# Patient Record
Sex: Male | Born: 1995 | Hispanic: Yes | Marital: Single | Smoking: Former smoker
Health system: Southern US, Community
[De-identification: ages and names within clinical notes are randomized; demographics above are authoritative.]

## PROBLEM LIST (undated history)

## (undated) DIAGNOSIS — E7800 Pure hypercholesterolemia, unspecified: Secondary | ICD-10-CM

## (undated) DIAGNOSIS — I10 Essential (primary) hypertension: Secondary | ICD-10-CM

---
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
## 2018-12-15 ENCOUNTER — Emergency Department
Admission: EM | Admit: 2018-12-15 | Discharge: 2018-12-15 | Disposition: A | Discharge status: Home / Self Care | Payer: BC Managed Care – PPO | Attending: Emergency Medicine | Admitting: Emergency Medicine

## 2018-12-15 ENCOUNTER — Emergency Department: Payer: BC Managed Care – PPO

## 2018-12-15 DIAGNOSIS — R079 Chest pain, unspecified: Secondary | ICD-10-CM | POA: Diagnosis not present

## 2018-12-15 DIAGNOSIS — Z87891 Personal history of nicotine dependence: Secondary | ICD-10-CM | POA: Insufficient documentation

## 2018-12-15 LAB — CBC
HCT: 47.5 % (ref 39.0–52.0)
Hemoglobin: 16.3 g/dL (ref 13.0–17.0)
MCH: 30.6 pg (ref 26.0–34.0)
MCHC: 34.3 g/dL (ref 30.0–36.0)
MCV: 89.3 fL (ref 80.0–100.0)
Platelets: 234 10*3/uL (ref 150–400)
RBC: 5.32 MIL/uL (ref 4.22–5.81)
RDW: 11.9 % (ref 11.5–15.5)
WBC: 6.3 10*3/uL (ref 4.0–10.5)
nRBC: 0 % (ref 0.0–0.2)

## 2018-12-15 LAB — BASIC METABOLIC PANEL
Anion gap: 11 (ref 5–15)
BUN: 8 mg/dL (ref 6–20)
CO2: 26 mmol/L (ref 22–32)
Calcium: 8.7 mg/dL — ABNORMAL LOW (ref 8.9–10.3)
Chloride: 102 mmol/L (ref 98–111)
Creatinine, Ser: 0.8 mg/dL (ref 0.61–1.24)
GFR calc Af Amer: >60 mL/min (ref >60)
GFR calc non Af Amer: >60 mL/min (ref >60)
Glucose, Bld: 108 mg/dL — ABNORMAL HIGH (ref 70–99)
Potassium: 3.8 mmol/L (ref 3.5–5.1)
Sodium: 139 mmol/L (ref 135–145)

## 2018-12-15 LAB — TROPONIN I (HIGH SENSITIVITY): Troponin I (High Sensitivity): 3 ng/L (ref <18)

## 2018-12-15 MED ORDER — SODIUM CHLORIDE 0.9% FLUSH: 3.0000 mL | Freq: Once | INTRAVENOUS | Status: DC

## 2018-12-15 NOTE — ED Notes (Signed)
Patient denies chest pain at this time, says it "went away" while he was waiting for room

## 2018-12-15 NOTE — ED Provider Notes (Signed)
[facility] Emergency Department Provider Note  ____________________________________________   First MD Initiated Contact with Patient 12/15/18 [phone]     (approximate)  I have reviewed the triage vital signs and the nursing notes.   HISTORY  Chief Complaint Chest Pain    HPI Matthew Holden is a 23 y.o. male reports no chronic medical issues and presents for evaluation of right-sided chest pain.  He states it happened when he was going to sleep or just after he went to sleep.  He describes as a sharp pain in the far right side of his chest.  He says it completely went away while he was waiting for an exam room.  He had no shortness of breath.  He had no injury.  He has had no contact with COVID-19 patients.  He denies sore throat, cough, congestion, nausea, vomiting, and abdominal pain.  He has never had chest pain like this in the past.  He denies drug use.  He has no history of blood clots in the legs of the lungs.   The pain is completely gone at this time.       History reviewed. No pertinent past medical history.  There are no active problems to display for this patient.   History reviewed. No pertinent surgical history.  Prior to Admission medications   Not on File    Allergies Patient has no known allergies.  No family history on file.  Social History Social History   Tobacco Use  . Smoking status: Former Research scientist (life sciences)  . Smokeless tobacco: Never Used  Substance Use Topics  . Alcohol use: Yes  . Drug use: Not on file    Review of Systems Constitutional: No fever/chills Eyes: No visual changes. ENT: No sore throat. Cardiovascular: +chest pain. Respiratory: Denies shortness of breath. Gastrointestinal: No abdominal pain.  No nausea, no vomiting.  No diarrhea.  No constipation. Genitourinary: Negative for dysuria. Musculoskeletal: Negative for neck pain.  Negative for back pain. Integumentary: Negative for rash. Neurological: Negative  for headaches, focal weakness or numbness.   ____________________________________________   PHYSICAL EXAM:  VITAL SIGNS: ED Triage Vitals  Enc Vitals Group     BP 12/15/18 0243 (!) 141/89     Pulse Rate 12/15/18 0243 93     Resp 12/15/18 0243 14     Temp 12/15/18 0243 98.7 F (37.1 C)     Temp Source 12/15/18 0243 Oral     SpO2 12/15/18 0243 100 %     Weight 12/15/18 0236 127 kg (280 lb)     Height 12/15/18 0236 1.702 m (5\' 7" )     Head Circumference --      Peak Flow --      Pain Score 12/15/18 0235 8     Pain Loc --      Pain Edu? --      Excl. in [place]? --     Constitutional: Alert and oriented. Well appearing and in no acute distress. Eyes: Conjunctivae are normal.  Head: Atraumatic. Nose: No congestion/rhinnorhea. Mouth/Throat: Mucous membranes are moist. Neck: No stridor.  No meningeal signs.   Cardiovascular: Normal rate, regular rhythm. Good peripheral circulation. Grossly normal heart sounds. Respiratory: Normal respiratory effort.  No retractions. No audible wheezing. Gastrointestinal: Soft and nontender. No distention.  Musculoskeletal: No reproducible chest wall tenderness no lower extremity tenderness nor edema. No gross deformities of extremities. Neurologic:  Normal speech and language. No gross focal neurologic deficits are appreciated.  Skin:  Skin is warm,  dry and intact. No rash noted. Psychiatric: Mood and affect are normal. Speech and behavior are normal.  ____________________________________________   LABS (all labs ordered are listed, but only abnormal results are displayed)  Labs Reviewed  BASIC METABOLIC PANEL - Abnormal; Notable for the following components:      Result Value   Glucose, Bld 108 (*)    Calcium 8.7 (*)    All other components within normal limits  CBC  TROPONIN I (HIGH SENSITIVITY)  TROPONIN I (HIGH SENSITIVITY)   ____________________________________________  EKG  ED ECG REPORT I, Loleta Roseory Rajat Staver, the attending  physician, personally viewed and interpreted this ECG.  Date: 12/15/2018 EKG Time: 2:37 AM Rate: 91 Rhythm: normal sinus rhythm QRS Axis: normal Intervals: normal ST/T Wave abnormalities: Non-specific ST segment / T-wave changes, but no clear evidence of acute ischemia. Narrative Interpretation: no definitive evidence of acute ischemia; does not meet STEMI criteria.   ____________________________________________  RADIOLOGY   ED MD interpretation: No indication of acute intrathoracic abnormality on chest x-ray  Official radiology report(s): Dg Chest Port 1 View  Result Date: 12/15/2018 CLINICAL DATA:  Chest pain with mild cough EXAM: PORTABLE CHEST 1 VIEW COMPARISON:  None. FINDINGS: Low lung volumes. There is no edema, consolidation, effusion, or pneumothorax. Normal heart size and mediastinal contours. IMPRESSION: Negative low volume chest. Electronically Signed   By: Marnee SpringJonathon  Watts M.D.   On: 12/15/2018 06:25    ____________________________________________   PROCEDURES   Procedure(s) performed (including Critical Care):  Procedures   ____________________________________________   INITIAL IMPRESSION / MDM / ASSESSMENT AND PLAN / ED COURSE  As part of my medical decision making, I reviewed the following data within the electronic MEDICAL RECORD NUMBER Nursing notes reviewed and incorporated, Labs reviewed , EKG interpreted , Radiograph reviewed  and Notes from prior ED visits   Differential diagnosis includes, but is not limited to, musculoskeletal pain, costochondritis, PE, ACS, acid reflux, pneumothorax, pneumonia.  The patient is in no distress with normal vital signs.  PERC negative, very low risk for ACS.  Nonischemic EKG, normal chest x-ray, normal lab work including a high-sensitivity troponin of 3.  He does not meet criteria for retesting of the troponin.  The patient says he has no symptoms and wants to go home.  I offered coronavirus testing but given that he has no  other symptoms at this time I do not think it is necessary and he does not want the test.  I gave my usual customary return precautions.       ____________________________________________  FINAL CLINICAL IMPRESSION(S) / ED DIAGNOSES  Final diagnoses:  Chest pain     MEDICATIONS GIVEN DURING THIS VISIT:  Medications  sodium chloride flush (NS) 0.9 % injection 3 mL (has no administration in time range)     ED Discharge Orders    None      *Please note:  Maryfrances BunnellFredrick Minteer was evaluated in Emergency Department on 12/15/2018 for the symptoms described in the history of present illness. He was evaluated in the context of the global COVID-19 pandemic, which necessitated consideration that the patient might be at risk for infection with the SARS-CoV-2 virus that causes COVID-19. Institutional protocols and algorithms that pertain to the evaluation of patients at risk for COVID-19 are in a state of rapid change based on information released by regulatory bodies including the CDC and federal and state organizations. These policies and algorithms were followed during the patient's care in the ED.  Some ED evaluations and interventions may  be delayed as a result of limited staffing during the pandemic.*  Note:  This document was prepared using Dragon voice recognition software and may include unintentional dictation errors.   Loleta RoseForbach, Geovonni Meyerhoff, MD 12/15/18 [phone]

## 2018-12-15 NOTE — Discharge Instructions (Addendum)
You have been seen in the Emergency Department (ED) today for chest pain.  As we have discussed today?s test results are normal, but you may require further testing. ° °Please follow up with the recommended doctor as instructed above in these documents regarding today?s emergent visit and your recent symptoms to discuss further management.  ° °Return to the Emergency Department (ED) if you experience any further chest pain/pressure/tightness, difficulty breathing, or sudden sweating, or other symptoms that concern you. ° °

## 2018-12-15 NOTE — ED Triage Notes (Signed)
Patient c/o medial chest pain. Patient denies other symptoms.

## 2018-12-15 NOTE — ED Notes (Signed)
Pt discharged home after verbalizing understanding of discharge instructions; nad noted. 

## 2019-04-30 ENCOUNTER — Emergency Department
Admission: EM | Admit: 2019-04-30 | Discharge: 2019-04-30 | Disposition: A | Discharge status: Home / Self Care | Payer: BC Managed Care – PPO | Attending: Emergency Medicine | Admitting: Emergency Medicine

## 2019-04-30 ENCOUNTER — Emergency Department: Payer: BC Managed Care – PPO

## 2019-04-30 ENCOUNTER — Encounter: Payer: Self-pay | Admitting: Emergency Medicine

## 2019-04-30 DIAGNOSIS — R079 Chest pain, unspecified: Secondary | ICD-10-CM | POA: Diagnosis present

## 2019-04-30 DIAGNOSIS — F1210 Cannabis abuse, uncomplicated: Secondary | ICD-10-CM | POA: Diagnosis not present

## 2019-04-30 DIAGNOSIS — R091 Pleurisy: Secondary | ICD-10-CM

## 2019-04-30 DIAGNOSIS — Z87891 Personal history of nicotine dependence: Secondary | ICD-10-CM | POA: Diagnosis not present

## 2019-04-30 LAB — BASIC METABOLIC PANEL
Anion gap: 12 (ref 5–15)
BUN: 15 mg/dL (ref 6–20)
CO2: 23 mmol/L (ref 22–32)
Calcium: 9.2 mg/dL (ref 8.9–10.3)
Chloride: 102 mmol/L (ref 98–111)
Creatinine, Ser: 0.99 mg/dL (ref 0.61–1.24)
GFR calc Af Amer: >60 mL/min (ref >60)
GFR calc non Af Amer: >60 mL/min (ref >60)
Glucose, Bld: 150 mg/dL — ABNORMAL HIGH (ref 70–99)
Potassium: 3.5 mmol/L (ref 3.5–5.1)
Sodium: 137 mmol/L (ref 135–145)

## 2019-04-30 LAB — CBC
HCT: 48.1 % (ref 39.0–52.0)
Hemoglobin: 16.6 g/dL (ref 13.0–17.0)
MCH: 30.5 pg (ref 26.0–34.0)
MCHC: 34.5 g/dL (ref 30.0–36.0)
MCV: 88.4 fL (ref 80.0–100.0)
Platelets: 256 10*3/uL (ref 150–400)
RBC: 5.44 MIL/uL (ref 4.22–5.81)
RDW: 11.8 % (ref 11.5–15.5)
WBC: 10 10*3/uL (ref 4.0–10.5)
nRBC: 0 % (ref 0.0–0.2)

## 2019-04-30 LAB — TROPONIN I (HIGH SENSITIVITY): Troponin I (High Sensitivity): 4 ng/L (ref <18)

## 2019-04-30 NOTE — ED Provider Notes (Signed)
[facility] Emergency Department Provider Note   First MD Initiated Contact with Patient 04/30/19 2315     (approximate)  I have reviewed the triage vital signs and the nursing notes.   HISTORY  Chief Complaint Chest Pain   HPI Matthew Holden is a 23 y.o. male presents to the emergency department with anterior burning chest pain" after smoking marijuana tonight.  Patient states that he smoked marijuana around 7:00 and started experiencing burning chest pain following doing so. .  Patient denies any dyspnea or cough.  Patient denies any lower extremity pain or swelling.  Patient states that he had a similar episode 1 month ago.        History reviewed. No pertinent past medical history.  There are no active problems to display for this patient.   History reviewed. No pertinent surgical history.  Prior to Admission medications   Not on File    Allergies Patient has no known allergies.  History reviewed. No pertinent family history.  Social History Social History   Tobacco Use  . Smoking status: Former Games developer  . Smokeless tobacco: Never Used  Substance Use Topics  . Alcohol use: Yes  . Drug use: Not on file    Review of Systems Constitutional: No fever/chills Eyes: No visual changes. ENT: No sore throat. Cardiovascular: Positive for burning chest pain. Respiratory: Denies shortness of breath. Gastrointestinal: No abdominal pain.  No nausea, no vomiting.  No diarrhea.  No constipation. Genitourinary: Negative for dysuria. Musculoskeletal: Negative for neck pain.  Negative for back pain. Integumentary: Negative for rash. Neurological: Negative for headaches, focal weakness or numbness.   ____________________________________________   PHYSICAL EXAM:  VITAL SIGNS: ED Triage Vitals  Enc Vitals Group     BP 04/30/19 2118 (!) 144/90     Pulse Rate 04/30/19 2118 89     Resp 04/30/19 2118 18     Temp 04/30/19 2118 98.8 F (37.1 C)      Temp Source 04/30/19 2118 Oral     SpO2 04/30/19 2118 98 %     Weight --      Height --      Head Circumference --      Peak Flow --      Pain Score 04/30/19 2313 4     Pain Loc --      Pain Edu? --      Excl. in [place]? --     Constitutional: Alert and oriented.  Eyes: Conjunctivae are normal.  Mouth/Throat: Patient is wearing a mask. Neck: No stridor.  No meningeal signs.   Cardiovascular: Normal rate, regular rhythm. Good peripheral circulation. Grossly normal heart sounds. Respiratory: Normal respiratory effort.  No retractions. Gastrointestinal: Soft and nontender. No distention.  Musculoskeletal: No lower extremity tenderness nor edema. No gross deformities of extremities. Neurologic:  Normal speech and language. No gross focal neurologic deficits are appreciated.  Skin:  Skin is warm, dry and intact. Psychiatric: Mood and affect are normal. Speech and behavior are normal.  ____________________________________________   LABS (all labs ordered are listed, but only abnormal results are displayed)  Labs Reviewed  BASIC METABOLIC PANEL - Abnormal; Notable for the following components:      Result Value   Glucose, Bld 150 (*)    All other components within normal limits  CBC  TROPONIN I (HIGH SENSITIVITY)  TROPONIN I (HIGH SENSITIVITY)   ____________________________________________  EKG  ED ECG REPORT I, Wilson N Ardyce Heyer, the attending physician, personally viewed and interpreted  this ECG.   Date: 04/30/2019  EKG Time: 9:20 PM  Rate: 89  Rhythm: Normal sinus rhythm   Axis: Normal  Intervals: Normal  ST&T Change: None  ____________________________________________  RADIOLOGY I, Enterprise N Eythan Jayne, personally viewed and evaluated these images (plain radiographs) as part of my medical decision making, as well as reviewing the written report by the radiologist.  ED MD interpretation: No active cardiopulmonary disease noted on chest x-ray.  Official radiology  report(s): Dg Chest 2 View  Result Date: 04/30/2019 CLINICAL DATA:  23 year old male with chest pain. EXAM: CHEST - 2 VIEW COMPARISON:  Chest radiograph dated 12/15/2018. FINDINGS: No focal consolidation, pleural effusion, or pneumothorax. The cardiac silhouette is within normal limits. No acute osseous pathology. IMPRESSION: No active cardiopulmonary disease. Electronically Signed   By: Anner Crete M.D.   On: 04/30/2019 21:37     Procedures   ____________________________________________   INITIAL IMPRESSION / MDM / [place] / ED COURSE  As part of my medical decision making, I reviewed the following data within the electronic MEDICAL RECORD NUMBER 23 year old male presented with above-stated history and physical exam concerning for possible pleurisy.  EKG was performed which revealed no evidence of ischemia or infarction laboratory data was performed while the patient was in triage revealed a normal high-sensitivity troponin. ____________________________________________  FINAL CLINICAL IMPRESSION(S) / ED DIAGNOSES  Final diagnoses:  Pleurisy     MEDICATIONS GIVEN DURING THIS VISIT:  Medications - No data to display   ED Discharge Orders    None      *Please note:  Matthew Holden was evaluated in Emergency Department on 04/30/2019 for the symptoms described in the history of present illness. He was evaluated in the context of the global COVID-19 pandemic, which necessitated consideration that the patient might be at risk for infection with the SARS-CoV-2 virus that causes COVID-19. Institutional protocols and algorithms that pertain to the evaluation of patients at risk for COVID-19 are in a state of rapid change based on information released by regulatory bodies including the CDC and federal and state organizations. These policies and algorithms were followed during the patient's care in the ED.  Some ED evaluations and interventions may be delayed as a result of  limited staffing during the pandemic.*  Note:  This document was prepared using Dragon voice recognition software and may include unintentional dictation errors.   Gregor Hams, MD 05/01/19 [phone]

## 2019-04-30 NOTE — ED Triage Notes (Signed)
Pt c/o left sided chest pain while hanging out with friend tonight smoking mariajuana. Pt reports pain occurred 1 month ago and was seen here, diagnose with muscle strain. Pt denies SOB, N/V.

## 2019-08-05 ENCOUNTER — Encounter: Payer: Self-pay | Admitting: Emergency Medicine

## 2019-08-05 ENCOUNTER — Emergency Department: Payer: BC Managed Care – PPO

## 2019-08-05 ENCOUNTER — Emergency Department
Admission: EM | Admit: 2019-08-05 | Discharge: 2019-08-05 | Disposition: A | Discharge status: Home / Self Care | Payer: BC Managed Care – PPO | Attending: Emergency Medicine | Admitting: Emergency Medicine

## 2019-08-05 ENCOUNTER — Other Ambulatory Visit: Payer: Self-pay

## 2019-08-05 DIAGNOSIS — K219 Gastro-esophageal reflux disease without esophagitis: Secondary | ICD-10-CM | POA: Diagnosis not present

## 2019-08-05 DIAGNOSIS — I10 Essential (primary) hypertension: Secondary | ICD-10-CM | POA: Insufficient documentation

## 2019-08-05 DIAGNOSIS — Z87891 Personal history of nicotine dependence: Secondary | ICD-10-CM | POA: Diagnosis not present

## 2019-08-05 DIAGNOSIS — R079 Chest pain, unspecified: Secondary | ICD-10-CM

## 2019-08-05 HISTORY — DX: Essential (primary) hypertension: I10

## 2019-08-05 HISTORY — DX: Pure hypercholesterolemia, unspecified: E78.00

## 2019-08-05 LAB — HEPATIC FUNCTION PANEL
ALT: 41 U/L (ref 0–44)
AST: 25 U/L (ref 15–41)
Albumin: 4.3 g/dL (ref 3.5–5.0)
Alkaline Phosphatase: 52 U/L (ref 38–126)
Bilirubin, Direct: <0.1 mg/dL (ref 0.0–0.2)
Total Bilirubin: 0.7 mg/dL (ref 0.3–1.2)
Total Protein: 7.7 g/dL (ref 6.5–8.1)

## 2019-08-05 LAB — BASIC METABOLIC PANEL
Anion gap: 9 (ref 5–15)
BUN: 11 mg/dL (ref 6–20)
CO2: 27 mmol/L (ref 22–32)
Calcium: 9 mg/dL (ref 8.9–10.3)
Chloride: 102 mmol/L (ref 98–111)
Creatinine, Ser: 0.85 mg/dL (ref 0.61–1.24)
GFR calc Af Amer: >60 mL/min (ref >60)
GFR calc non Af Amer: >60 mL/min (ref >60)
Glucose, Bld: 108 mg/dL — ABNORMAL HIGH (ref 70–99)
Potassium: 3.9 mmol/L (ref 3.5–5.1)
Sodium: 138 mmol/L (ref 135–145)

## 2019-08-05 LAB — CBC
HCT: 48.4 % (ref 39.0–52.0)
Hemoglobin: 16.6 g/dL (ref 13.0–17.0)
MCH: 30.7 pg (ref 26.0–34.0)
MCHC: 34.3 g/dL (ref 30.0–36.0)
MCV: 89.5 fL (ref 80.0–100.0)
Platelets: 238 10*3/uL (ref 150–400)
RBC: 5.41 MIL/uL (ref 4.22–5.81)
RDW: 11.9 % (ref 11.5–15.5)
WBC: 10 10*3/uL (ref 4.0–10.5)
nRBC: 0 % (ref 0.0–0.2)

## 2019-08-05 LAB — TROPONIN I (HIGH SENSITIVITY): Troponin I (High Sensitivity): <2 ng/L (ref <18)

## 2019-08-05 LAB — LIPASE, BLOOD: Lipase: 19 U/L (ref 11–51)

## 2019-08-05 LAB — FIBRIN DERIVATIVES D-DIMER (ARMC ONLY): Fibrin derivatives D-dimer (ARMC): 207.92 ng/mL (FEU) (ref 0.00–499.00)

## 2019-08-05 MED ORDER — FAMOTIDINE 20 MG PO TABS: 20.0000 mg | ORAL_TABLET | Freq: Two times a day (BID) | ORAL | 0 refills | Status: AC

## 2019-08-05 MED ORDER — LIDOCAINE VISCOUS HCL 2 % MT SOLN
15.0000 mL | Freq: Once | OROMUCOSAL | Status: AC
  Administered 2019-08-05: 06:00:00 15 mL via ORAL
  Filled 2019-08-05: qty 15

## 2019-08-05 MED ORDER — ALUM & MAG HYDROXIDE-SIMETH 200-200-20 MG/5ML PO SUSP
30.0000 mL | Freq: Once | ORAL | Status: AC
  Administered 2019-08-05: 30 mL via ORAL
  Filled 2019-08-05: qty 30

## 2019-08-05 NOTE — Discharge Instructions (Signed)
1.  Start Pepcid 20 mg twice daily. 2.  Avoid foods which are heavy, greasy, spicy and alcohol. 3.  Return to the ER for worsening symptoms, persistent vomiting, difficulty breathing or other concerns.

## 2019-08-05 NOTE — ED Provider Notes (Signed)
[facility] Emergency Department Provider Note   ____________________________________________   First MD Initiated Contact with Patient 08/05/19 [phone]     (approximate)  I have reviewed the triage vital signs and the nursing notes.   HISTORY  Chief Complaint Chest Pain    HPI Matthew Holden is a 24 y.o. male who presents to the ED from home with a chief complaint of chest pain.  Patient reports pain to the center of his chest intermittently since November 2020.  Was evaluated in the ED with an unremarkable work-up.  States he went to see a doctor in [place] and was diagnosed with high cholesterol.  Presents tonight because he could not sleep.  Reports pain often worse after eating and sometimes feel like it is burning.  Denies associated diaphoresis, shortness of breath, nausea/vomiting, palpitations, dizziness.  Denies recent travel, trauma or hormone use.  Denies history of COVID-19 or COVID-19 exposure.       Past Medical History:  Diagnosis Date  . High cholesterol   . Hypertension     There are no problems to display for this patient.   History reviewed. No pertinent surgical history.  Prior to Admission medications   Medication Sig Start Date End Date Taking? Authorizing Provider  pravastatin (PRAVACHOL) 10 MG tablet Take 10 mg by mouth daily.   Yes [provider]  famotidine (PEPCID) 20 MG tablet Take 1 tablet (20 mg total) by mouth 2 (two) times daily. 08/05/19   Irean Hong, MD    Allergies Patient has no known allergies.  History reviewed. No pertinent family history.  Social History Social History   Tobacco Use  . Smoking status: Former Games developer  . Smokeless tobacco: Never Used  Substance Use Topics  . Alcohol use: Not Currently  . Drug use: Never    Review of Systems  Constitutional: No fever/chills Eyes: No visual changes. ENT: No sore throat. Cardiovascular: Positive for chest pain. Respiratory: Denies  shortness of breath. Gastrointestinal: No abdominal pain.  No nausea, no vomiting.  No diarrhea.  No constipation. Genitourinary: Negative for dysuria. Musculoskeletal: Negative for back pain. Skin: Negative for rash. Neurological: Negative for headaches, focal weakness or numbness.   ____________________________________________   PHYSICAL EXAM:  VITAL SIGNS: ED Triage Vitals  Enc Vitals Group     BP 08/05/19 0438 130/83     Pulse Rate 08/05/19 0438 66     Resp 08/05/19 0438 18     Temp 08/05/19 0438 (!) 97.5 F (36.4 C)     Temp Source 08/05/19 0438 Oral     SpO2 08/05/19 0438 100 %     Weight 08/05/19 0430 270 lb (122.5 kg)     Height 08/05/19 0430 5\' 11"  (1.803 m)     Head Circumference --      Peak Flow --      Pain Score 08/05/19 0446 7     Pain Loc --      Pain Edu? --      Excl. in GC? --     Constitutional: Alert and oriented. Well appearing and in no acute distress. Eyes: Conjunctivae are normal. PERRL. EOMI. Head: Atraumatic. Nose: No congestion/rhinnorhea. Mouth/Throat: Mucous membranes are moist.  Oropharynx non-erythematous. Neck: No stridor.   Cardiovascular: Normal rate, regular rhythm. Grossly normal heart sounds.  Good peripheral circulation. Respiratory: Normal respiratory effort.  No retractions. Lungs CTAB.  Anterior chest mildly tender to palpation. Gastrointestinal: Soft and nontender to D palpation. No distention. No abdominal bruits.  No CVA tenderness. Musculoskeletal: No lower extremity tenderness nor edema.  No joint effusions. Neurologic:  Normal speech and language. No gross focal neurologic deficits are appreciated. No gait instability. Skin:  Skin is warm, dry and intact. No rash noted. Psychiatric: Mood and affect are normal. Speech and behavior are normal.  ____________________________________________   LABS (all labs ordered are listed, but only abnormal results are displayed)  Labs Reviewed  BASIC METABOLIC PANEL - Abnormal;  Notable for the following components:      Result Value   Glucose, Bld 108 (*)    All other components within normal limits  CBC  HEPATIC FUNCTION PANEL  LIPASE, BLOOD  FIBRIN DERIVATIVES D-DIMER ([facility] ONLY)  TROPONIN I (HIGH SENSITIVITY)   ____________________________________________  EKG  ED ECG REPORT I, Aashrith Eves J, the attending physician, personally viewed and interpreted this ECG.   Date: 08/05/2019  EKG Time: 0436  Rate: 75  Rhythm: normal EKG, normal sinus rhythm  Axis: Normal  Intervals:none  ST&T Change: Nonspecific  ____________________________________________  RADIOLOGY  ED MD interpretation: No acute cardiopulmonary process  Official radiology report(s): DG Chest 2 View  Result Date: 08/05/2019 CLINICAL DATA:  24 year old male with history of intermittent chest pain since November 2020. EXAM: CHEST - 2 VIEW COMPARISON:  Chest x-ray April 30, 2019. FINDINGS: Lung volumes are normal. No consolidative airspace disease. No pleural effusions. No pneumothorax. No pulmonary nodule or mass noted. Pulmonary vasculature and the cardiomediastinal silhouette are within normal limits. IMPRESSION: No radiographic evidence of acute cardiopulmonary disease. Electronically Signed   By: Trudie Reed M.D.   On: 08/05/2019 05:04    ____________________________________________   PROCEDURES  Procedure(s) performed (including Critical Care):  Procedures   ____________________________________________   INITIAL IMPRESSION / ASSESSMENT AND PLAN / ED COURSE  As part of my medical decision making, I reviewed the following data within the electronic MEDICAL RECORD NUMBER Nursing notes reviewed and incorporated, Labs reviewed, EKG interpreted, Old chart reviewed, Radiograph reviewed and Notes from prior ED visits     Matthew Holden was evaluated in Emergency Department on 08/05/2019 for the symptoms described in the history of present illness. He was evaluated in the context of  the global COVID-19 pandemic, which necessitated consideration that the patient might be at risk for infection with the SARS-CoV-2 virus that causes COVID-19. Institutional protocols and algorithms that pertain to the evaluation of patients at risk for COVID-19 are in a state of rapid change based on information released by regulatory bodies including the CDC and federal and state organizations. These policies and algorithms were followed during the patient's care in the ED.    24 year old male presenting for chest pain since November. Differential diagnosis includes, but is not limited to, ACS, aortic dissection, pulmonary embolism, cardiac tamponade, pneumothorax, pneumonia, pericarditis, myocarditis, GI-related causes including esophagitis/gastritis, and musculoskeletal chest wall pain.    Will obtain cardiac work-up to include LFTs and lipase, D-dimer; chest x-ray.  Administer GI cocktail and reassess.   Clinical Course as of Aug 04 657  Wynelle Link Aug 05, 2019  1607 Patient feeling much better after GI cocktail.  Updated him on laboratory and imaging results.  Will discharge home on Pepcid twice daily and refer to cardiology for outpatient follow-up.  Strict return precautions given.  Patient verbalizes understanding agrees with plan of care.   [JS]    Clinical Course User Index [JS] Irean Hong, MD     ____________________________________________   FINAL CLINICAL IMPRESSION(S) / ED DIAGNOSES  Final diagnoses:  Nonspecific  chest pain  Gastroesophageal reflux disease, unspecified whether esophagitis present     ED Discharge Orders         Ordered    famotidine (PEPCID) 20 MG tablet  2 times daily     08/05/19 5456           Note:  This document was prepared using Dragon voice recognition software and may include unintentional dictation errors.   Paulette Blanch, MD 08/05/19 [phone]

## 2019-08-05 NOTE — ED Triage Notes (Signed)
Pt reports pain to the center of his chest intermittently since November; pt says he was having trouble sleeping tonight due to the pain so he came in to be evaluated; pt says he has been here twice and we told him everything was fine; so he went to see a doctor in [place] and was told he has high cholesterol; pt has been taking the medication he was prescribed since then (pravastatin)

## 2019-09-26 ENCOUNTER — Other Ambulatory Visit: Payer: BC Managed Care – PPO

## 2021-06-12 NOTE — ED Provider Notes (Signed)
 [facility] Emergency Department Provider Note    HPI, ED Course, Assessment and Plan   Matthew Holden is a 26 y.o. male previously healthy presenting for 2 days of fever, cough, shortness of breath.  BP 127/89   Pulse 95   Temp 37.1 C (98.8 F) (Oral)   Resp 18   SpO2 97%    Diagnostic workup as below.   Orders Placed This Encounter  Procedures  . Respiratory Pathogen Panel with COVID-19    MDM  Well-appearing 26 year old no past medical problems presenting for 2 days of fever, cough, shortness of breath.  COVID-positive.  Satting well on room air.  No increased work of breathing.  Lungs clear to auscultation bilaterally.  Will discharge with COVID return precautions and PCP follow-up.   Past History   PAST MEDICAL HISTORY/PAST SURGICAL HISTORY:  No past medical history on file.  No past surgical history on file.  MEDICATIONS:  No current facility-administered medications for this encounter. No current outpatient medications on file.  ALLERGIES:  Patient has no known allergies.  SOCIAL HISTORY:  Social History   Tobacco Use  . Smoking status: Never  . Smokeless tobacco: Never  Substance Use Topics  . Alcohol use: Not on file    FAMILY HISTORY: No family history on file.    Review of Systems  10 point review systems conducted and negative except for stated in HPI   Physical Exam   Vitals:   06/12/21 0203 06/12/21 0205 06/12/21 0501  BP:  140/94 127/89  Pulse: 125 126 95  Resp:  18 18  Temp:  37.4 C (99.3 F) 37.1 C (98.8 F)  TempSrc:  Oral Oral  SpO2: 100% 97% 97%     Constitutional: In no distress. Eyes: Conjunctivae are normal. HEENT: Normocephalic and atraumatic.  Cardiovascular: See heart rate listed above. Normal skin perfusion Respiratory: See respiratory rate listed above. Speaking easily in full sentences Gastrointestinal: Soft, non-distended, non-tender  Musculoskeletal: Nontender with normal range of motion in all  extremities.  Neurologic: Normal speech and language. No gross focal neurologic deficits are appreciated. Patient is moving all extremities equally, face is symmetric at rest and with speech.  Skin: Skin is warm, dry and intact. Psychiatric: Mood and affect are normal.   Radiology   No orders to display     Laboratory Data   Lab Results  Component Value Date   WBC 10.0 02/26/2020   HGB 19.3 (H) 02/26/2020   HCT 54.6 (H) 02/26/2020   PLT  02/26/2020     Comment:     Too Clumped to count: Adequate.     Lab Results  Component Value Date   NA 137 02/26/2020   K 3.9 02/26/2020   CL 104 02/26/2020   CO2 25.0 02/26/2020   BUN 10 02/26/2020   CREATININE 0.90 02/26/2020   GLU 104 02/26/2020   CALCIUM 9.6 02/26/2020    Lab Results  Component Value Date   BILITOT 0.4 02/26/2020   PROT 8.6 (H) 02/26/2020   ALBUMIN 4.3 02/26/2020   ALT 42 02/26/2020   AST 31 02/26/2020   ALKPHOS 77 02/26/2020    No results found for: LABPROT, INR, APTT  Portions of this record have been created using Scientist, clinical (histocompatibility and immunogenetics). Dictation errors have been sought, but may not have been identified and corrected.    Elsie JAYSON Lani Mickey., MD Resident 06/12/21 [phone]

## 2021-07-03 ENCOUNTER — Other Ambulatory Visit: Payer: Self-pay

## 2021-07-03 ENCOUNTER — Emergency Department: Payer: 59

## 2021-07-03 ENCOUNTER — Emergency Department
Admission: EM | Admit: 2021-07-03 | Discharge: 2021-07-03 | Disposition: A | Discharge status: Home / Self Care | Payer: 59 | Attending: Emergency Medicine | Admitting: Emergency Medicine

## 2021-07-03 DIAGNOSIS — K8050 Calculus of bile duct without cholangitis or cholecystitis without obstruction: Secondary | ICD-10-CM | POA: Diagnosis not present

## 2021-07-03 DIAGNOSIS — I10 Essential (primary) hypertension: Secondary | ICD-10-CM | POA: Diagnosis not present

## 2021-07-03 DIAGNOSIS — Z8616 Personal history of COVID-19: Secondary | ICD-10-CM | POA: Diagnosis not present

## 2021-07-03 DIAGNOSIS — R1011 Right upper quadrant pain: Secondary | ICD-10-CM

## 2021-07-03 LAB — HEPATIC FUNCTION PANEL
ALT: 59 U/L — ABNORMAL HIGH (ref 0–44)
AST: 48 U/L — ABNORMAL HIGH (ref 15–41)
Albumin: 3.8 g/dL (ref 3.5–5.0)
Alkaline Phosphatase: 47 U/L (ref 38–126)
Bilirubin, Direct: <0.1 mg/dL (ref 0.0–0.2)
Total Bilirubin: 0.5 mg/dL (ref 0.3–1.2)
Total Protein: 7.4 g/dL (ref 6.5–8.1)

## 2021-07-03 LAB — CBC
HCT: 48.5 % (ref 39.0–52.0)
Hemoglobin: 16.5 g/dL (ref 13.0–17.0)
MCH: 30.2 pg (ref 26.0–34.0)
MCHC: 34 g/dL (ref 30.0–36.0)
MCV: 88.7 fL (ref 80.0–100.0)
Platelets: 255 10*3/uL (ref 150–400)
RBC: 5.47 MIL/uL (ref 4.22–5.81)
RDW: 11.9 % (ref 11.5–15.5)
WBC: 11 10*3/uL — ABNORMAL HIGH (ref 4.0–10.5)
nRBC: 0 % (ref 0.0–0.2)

## 2021-07-03 LAB — URINALYSIS, COMPLETE (UACMP) WITH MICROSCOPIC
Bacteria, UA: NONE SEEN
Bilirubin Urine: NEGATIVE
Glucose, UA: NEGATIVE mg/dL
Ketones, ur: NEGATIVE mg/dL
Leukocytes,Ua: NEGATIVE
Nitrite: NEGATIVE
Protein, ur: NEGATIVE mg/dL
Specific Gravity, Urine: >1.03 — ABNORMAL HIGH (ref 1.005–1.030)
Squamous Epithelial / HPF: NONE SEEN (ref 0–5)
pH: 5.5 (ref 5.0–8.0)

## 2021-07-03 LAB — BASIC METABOLIC PANEL
Anion gap: 7 (ref 5–15)
BUN: 16 mg/dL (ref 6–20)
CO2: 27 mmol/L (ref 22–32)
Calcium: 8.9 mg/dL (ref 8.9–10.3)
Chloride: 101 mmol/L (ref 98–111)
Creatinine, Ser: 1 mg/dL (ref 0.61–1.24)
GFR, Estimated: >60 mL/min (ref >60)
Glucose, Bld: 110 mg/dL — ABNORMAL HIGH (ref 70–99)
Potassium: 3.4 mmol/L — ABNORMAL LOW (ref 3.5–5.1)
Sodium: 135 mmol/L (ref 135–145)

## 2021-07-03 LAB — TROPONIN I (HIGH SENSITIVITY)
Troponin I (High Sensitivity): 6 ng/L (ref <18)
Troponin I (High Sensitivity): 6 ng/L (ref <18)

## 2021-07-03 LAB — LIPASE, BLOOD: Lipase: 25 U/L (ref 11–51)

## 2021-07-03 MED ORDER — IBUPROFEN 400 MG PO TABS
ORAL_TABLET | ORAL | Status: AC
  Filled 2021-07-03: qty 1

## 2021-07-03 MED ORDER — IBUPROFEN 400 MG PO TABS
600.0000 mg | ORAL_TABLET | Freq: Once | ORAL | Status: AC
  Administered 2021-07-03: 600 mg via ORAL
  Filled 2021-07-03: qty 2

## 2021-07-03 NOTE — ED Provider Notes (Signed)
[facility] Provider Note    Event Date/Time   First MD Initiated Contact with Patient 07/03/21 [phone]     (approximate)   History   Chest Pain   HPI  Margie Brink is a 26 y.o. male with a history of obesity, hyperlipidemia, hypertension who presents for evaluation of right-sided abdominal pain.  Patient reports that he woke up with this pain yesterday morning. Initially it felt like a severe cramp but after that a soreness developed and that has been constant for the whole day. worse with deep inspiration, located in the right lower chest wall/upper abdomen.  No nausea or vomiting, no dysuria or hematuria, no fever or chills, no chest pain, no shortness of breath.  Patient did have COVID 2 weeks ago but denies any personal or family history of PE or DVT, no leg pain or swelling, no hemoptysis or exogenous hormones.  Denies any trauma.     Past Medical History:  Diagnosis Date   High cholesterol    Hypertension     No past surgical history on file.   Physical Exam   Triage Vital Signs: ED Triage Vitals [07/03/21 0247]  Enc Vitals Group     BP (!) 160/108     Pulse Rate 84     Resp 18     Temp 98.4 F (36.9 C)     Temp Source Oral     SpO2 100 %     Weight      Height      Head Circumference      Peak Flow      Pain Score 8     Pain Loc      Pain Edu?      Excl. in GC?     Most recent vital signs: Vitals:   07/03/21 0247 07/03/21 0605  BP: (!) 160/108 135/78  Pulse: 84 81  Resp: 18 17  Temp: 98.4 F (36.9 C)   SpO2: 100% 97%     Constitutional: Alert and oriented. Well appearing and in no apparent distress. HEENT:      Head: Normocephalic and atraumatic.         Eyes: Conjunctivae are normal. Sclera is non-icteric.       Mouth/Throat: Mucous membranes are moist.       Neck: Supple with no signs of meningismus. Cardiovascular: Regular rate and rhythm. No murmurs, gallops, or rubs. 2+ symmetrical distal pulses are present in  all extremities.  Respiratory: Normal respiratory effort. Lungs are clear to auscultation bilaterally.  Gastrointestinal: Soft, tender to palpation over the RUQ with negative Murphy's sign, and non distended with positive bowel sounds. No rebound or guarding. Genitourinary: No CVA tenderness. Musculoskeletal:  No edema, cyanosis, or erythema of extremities. Neurologic: Normal speech and language. Face is symmetric. Moving all extremities. No gross focal neurologic deficits are appreciated. Skin: Skin is warm, dry and intact. No rash noted. Psychiatric: Mood and affect are normal. Speech and behavior are normal.  ED Results / Procedures / Treatments   Labs (all labs ordered are listed, but only abnormal results are displayed) Labs Reviewed  BASIC METABOLIC PANEL - Abnormal; Notable for the following components:      Result Value   Potassium 3.4 (*)    Glucose, Bld 110 (*)    All other components within normal limits  CBC - Abnormal; Notable for the following components:   WBC 11.0 (*)    All other components within normal limits  HEPATIC FUNCTION  PANEL - Abnormal; Notable for the following components:   AST 48 (*)    ALT 59 (*)    All other components within normal limits  URINALYSIS, COMPLETE (UACMP) WITH MICROSCOPIC - Abnormal; Notable for the following components:   Specific Gravity, Urine >1.030 (*)    Hgb urine dipstick TRACE (*)    All other components within normal limits  LIPASE, BLOOD  TROPONIN I (HIGH SENSITIVITY)  TROPONIN I (HIGH SENSITIVITY)     EKG  ED ECG REPORT I, Rudene Re, the attending physician, personally viewed and interpreted this ECG.  Normal sinus rhythm with left axis deviation, S1Q3T3, no ST elevations or depressions.  Unchanged when compared to prior from March 2021   RADIOLOGY I, Rudene Re, attending MD, have personally viewed and interpreted the images obtained during this visit as below:  Chest x-ray with no acute  findings  RUQ US showing cholelithiasis with no evidence of cholecystitis  CT abdomen pelvis with no acute finding   ___________________________________________________ Interpretation by Radiologist:  DG Chest Portable 1 View  Result Date: 07/03/2021 CLINICAL DATA:  Right-sided rib pain for 2 days, initial encounter EXAM: PORTABLE CHEST 1 VIEW COMPARISON:  08/05/2019 FINDINGS: The heart size and mediastinal contours are within normal limits. Both lungs are clear. The visualized skeletal structures are unremarkable. IMPRESSION: No active disease. Electronically Signed   By: Inez Catalina M.D.   On: 07/03/2021 03:04   CT Renal Stone Study  Result Date: 07/03/2021 CLINICAL DATA:  Right-sided rib and flank pain. EXAM: CT ABDOMEN AND PELVIS WITHOUT CONTRAST TECHNIQUE: Multidetector CT imaging of the abdomen and pelvis was performed following the standard protocol without IV contrast. RADIATION DOSE REDUCTION: This exam was performed according to the departmental dose-optimization program which includes automated exposure control, adjustment of the mA and/or kV according to patient size and/or use of iterative reconstruction technique. COMPARISON:  None. FINDINGS: Lower chest: Unremarkable. No evidence for right pleural effusion. No focal airspace consolidation at the right base. Hepatobiliary: The liver shows diffusely decreased attenuation suggesting fat deposition. No focal abnormality in the liver on this study without intravenous contrast. Subcapsular areas of increased attenuation in segment IV and along the gallbladder fossa are compatible with areas of fatty sparing. There is no evidence for gallstones, gallbladder wall thickening, or pericholecystic fluid. No intrahepatic or extrahepatic biliary dilation. Pancreas: No focal mass lesion. No dilatation of the main duct. No intraparenchymal cyst. No peripancreatic edema. Spleen: No splenomegaly. No focal mass lesion. Adrenals/Urinary Tract: No adrenal  nodule or mass. Kidneys unremarkable. Specifically, no evidence for stone disease in either kidney or ureter. No secondary changes in either kidney or ureter. The urinary bladder appears normal for the degree of distention. Stomach/Bowel: Stomach is unremarkable. No gastric wall thickening. No evidence of outlet obstruction. Duodenum is normally positioned as is the ligament of Treitz. No small bowel wall thickening. No small bowel dilatation. The terminal ileum is normal. The appendix is normal. No gross colonic mass. No colonic wall thickening. Vascular/Lymphatic: No abdominal aortic aneurysm There is no gastrohepatic or hepatoduodenal ligament lymphadenopathy. No retroperitoneal or mesenteric lymphadenopathy. No pelvic sidewall lymphadenopathy. Reproductive: The prostate gland and seminal vesicles are unremarkable. Other: No intraperitoneal free fluid. Musculoskeletal: No worrisome lytic or sclerotic osseous abnormality. Visualized lower right ribs are unremarkable. Small sclerotic focus right femoral neck is likely a bone island. IMPRESSION: 1. No acute findings in the abdomen or pelvis. Specifically, no stone disease or secondary change in either kidney or ureter. No findings to explain  the patient's history of right-sided pain. 2. Hepatic steatosis. Electronically Signed   By: Misty Stanley M.D.   On: 07/03/2021 06:43   US ABDOMEN LIMITED RUQ (LIVER/GB)  Result Date: 07/03/2021 CLINICAL DATA:  26 year old male with right upper quadrant pain since yesterday morning. EXAM: ULTRASOUND ABDOMEN LIMITED RIGHT UPPER QUADRANT COMPARISON:  None. FINDINGS: Gallbladder: Small mobile 6 mm gallstone (image 19). Gallbladder wall thickness remains normal to 3 mm. No pericholecystic fluid. No sonographic Murphy sign elicited. Common bile duct: Diameter: 5 mm, normal. Liver: Echogenic liver (image 34), difficult to penetrate in some areas. No discrete liver lesion. No intrahepatic biliary ductal dilatation. Portal vein is  patent on color Doppler imaging with normal direction of blood flow towards the liver. Other: Negative visible right kidney. No right upper quadrant free fluid. IMPRESSION: 1. Small gallstone. No evidence of acute cholecystitis or bile duct obstruction. 2. Fatty liver disease. Electronically Signed   By: Genevie Ann M.D.   On: 07/03/2021 04:35      PROCEDURES:  Critical Care performed: No  Procedures    IMPRESSION / MDM / ASSESSMENT AND PLAN / ED COURSE  I reviewed the triage vital signs and the nursing notes.  26 y.o. male with a history of obesity, hyperlipidemia, hypertension who presents for evaluation of right-sided abdominal pain/ lower chest pain. Pain started as a severe cramp and now a soreness for the last day. No associated symptoms.  Patient does not seem in any distress, slightly hypertensive in the setting of pain, abdomen is nondistended and tender to palpation the right upper quadrant with negative Murphy sign  Ddx: Gallbladder pathology versus pancreatitis versus kidney stone versus costochondritis versus pleurisy versus pneumonia.  With recent diagnosis of COVID PE is also a possibility but felt less likely with no tachypnea, tachycardia, or hypoxia   Plan: CBC, CMP, lipase, UA, EKG, troponin, chest x-ray, right upper quadrant ultrasound.  Will give ibuprofen for pain   MEDICATIONS GIVEN IN ED: Medications  ibuprofen (ADVIL) tablet 600 mg (600 mg Oral Given 07/03/21 0324)     ED COURSE: EKG and troponin with no signs of ischemia.  UA with no signs of infection but did have some trace hemoglobin therefore patient was sent for CT which was unremarkable.  Mildly elevated LFTs with normal alk phos and T bili and mild leukocytosis.  Right upper quadrant ultrasound showing cholelithiasis with no evidence of cholecystitis.  Normal lipase.  After 600 mg of ibuprofen patient's pain has fully resolved.  Serial abdominal exams with no tenderness to palpation.  Patient remains with no  chest pain, no hypoxia, no tachypnea, no tachycardia.  Referral sent to surgery for outpatient evaluation for possible need for cholecystectomy.  Discussed my standard return precautions and follow-up.  Admission considered but felt unnecessary with no signs of cholecystitis, normal labs, and symptoms fully resolved.   Consults: None   EMR reviewed including patient's last visit with his primary care doctor from December 2021 for GERD    FINAL CLINICAL IMPRESSION(S) / ED DIAGNOSES   Final diagnoses:  RUQ abdominal pain  Calculus of bile duct without cholecystitis and without obstruction     Rx / DC Orders   ED Discharge Orders          Ordered    Ambulatory referral to General Surgery       Comments: cholelithiasis   07/03/21 0658             Note:  This document was prepared using Dragon  voice recognition software and may include unintentional dictation errors.   Please note:  Patient was evaluated in Emergency Department today for the symptoms described in the history of present illness. Patient was evaluated in the context of the global COVID-19 pandemic, which necessitated consideration that the patient might be at risk for infection with the SARS-CoV-2 virus that causes COVID-19. Institutional protocols and algorithms that pertain to the evaluation of patients at risk for COVID-19 are in a state of rapid change based on information released by regulatory bodies including the CDC and federal and state organizations. These policies and algorithms were followed during the patient's care in the ED.  Some ED evaluations and interventions may be delayed as a result of limited staffing during the pandemic.       Alfred Levins, [place], MD 07/03/21 0700

## 2021-07-03 NOTE — ED Triage Notes (Signed)
Pt woke up yesterday morning with right sided rib pain that will not go away.

## 2021-07-14 ENCOUNTER — Ambulatory Visit: Payer: 59 | Admitting: Surgery

## 2021-07-29 ENCOUNTER — Encounter: Payer: Self-pay | Admitting: Surgery

## 2023-07-18 IMAGING — US US ABDOMEN LIMITED
1 series · 14 of 25 positions shown · non-contrast
Comparison: None.

CLINICAL DATA: 25-year-old male with right upper quadrant pain
since yesterday morning.

EXAM:
ULTRASOUND ABDOMEN LIMITED RIGHT UPPER QUADRANT

[Series 1: us abdomen limited ruq (liver/gb) · 14 of 38 slices shown]
[im 1/38]
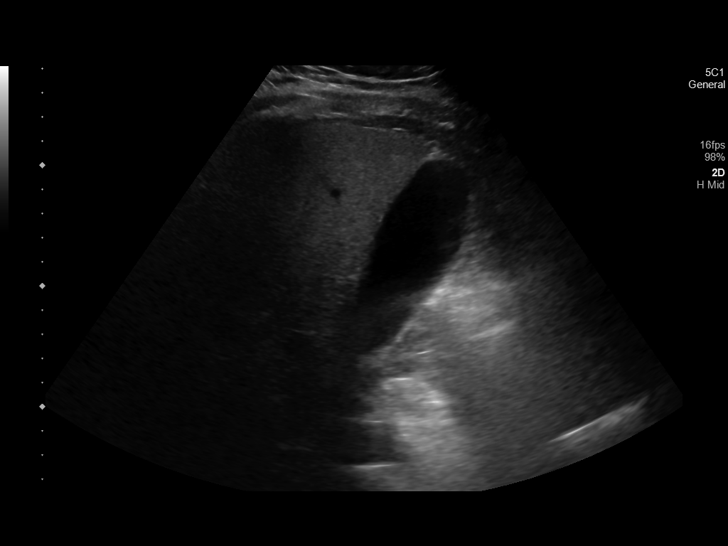
[im 4/38]
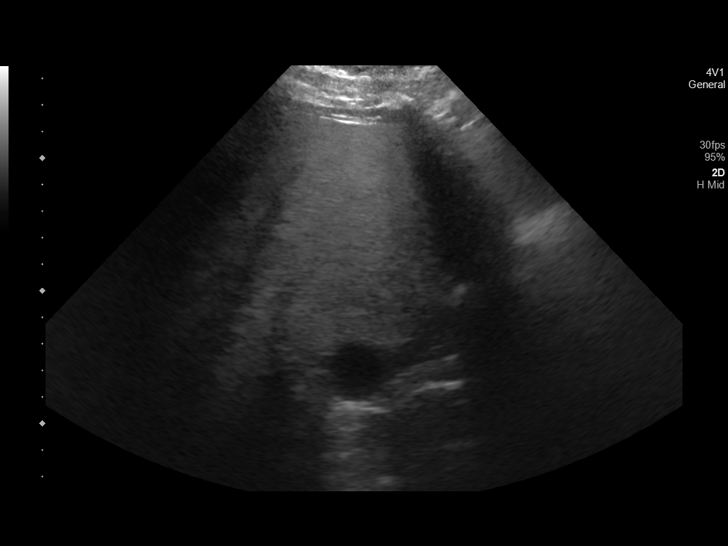
[im 7/38]
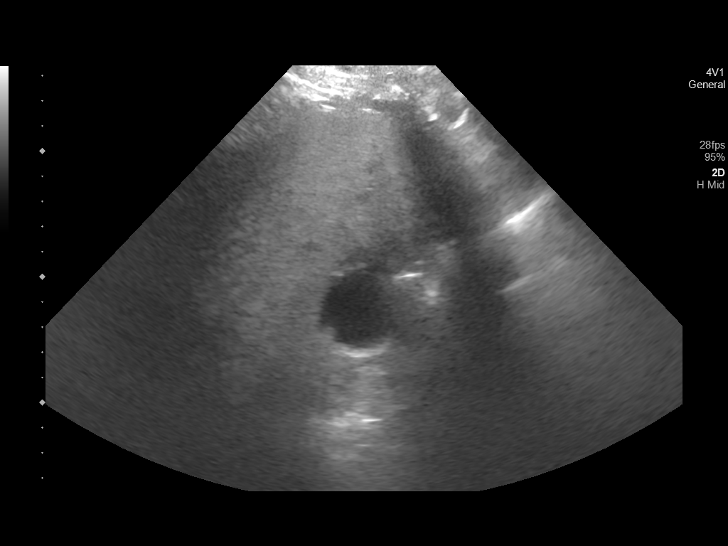
[im 10/38]
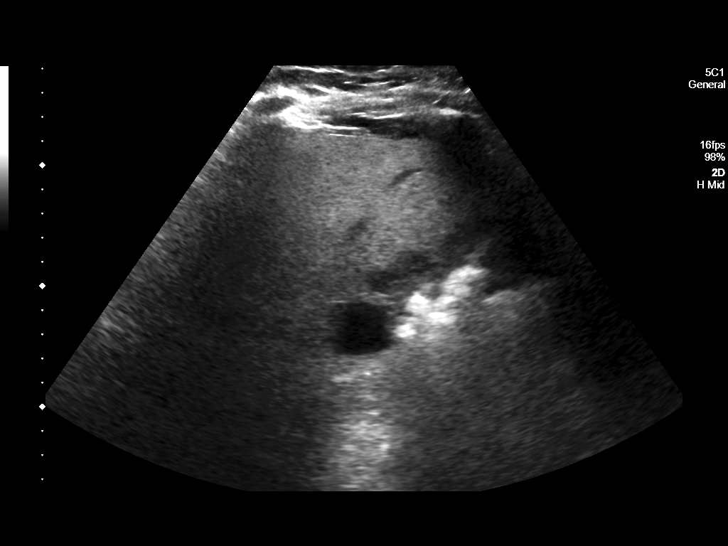
[im 13/38]
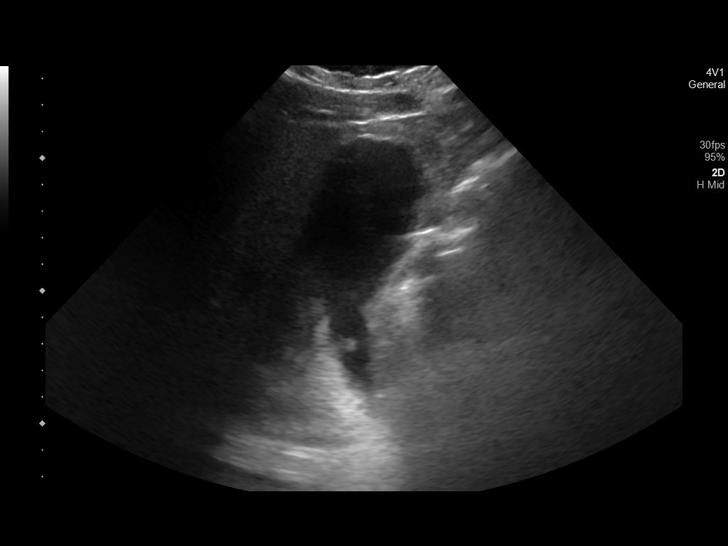
[im 14/38]
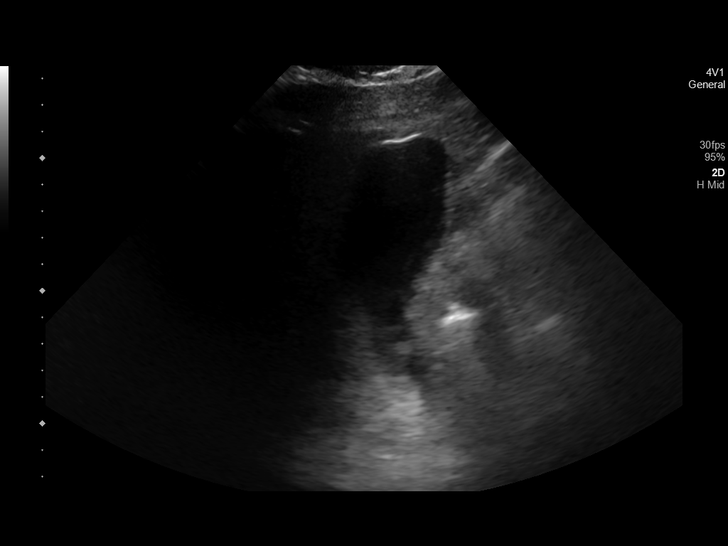
[im 17/38]
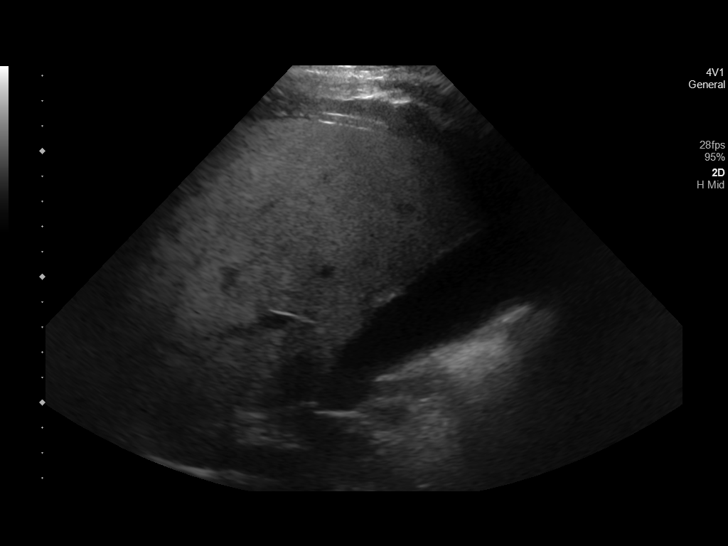
[im 21/38]
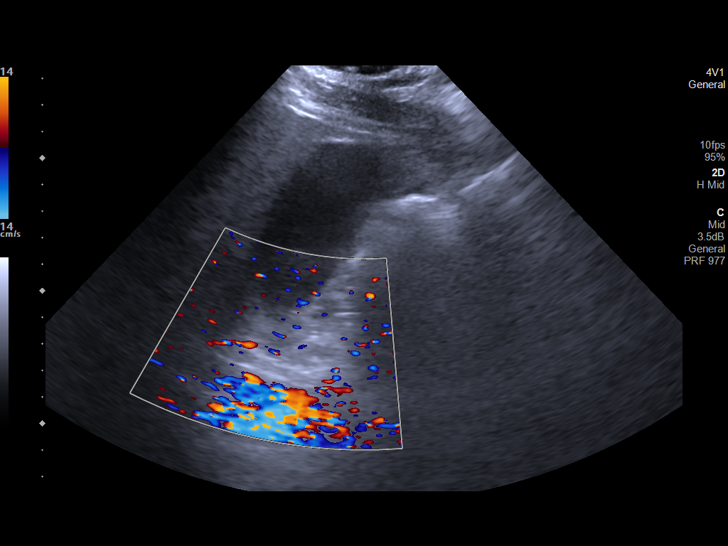
[im 24/38]
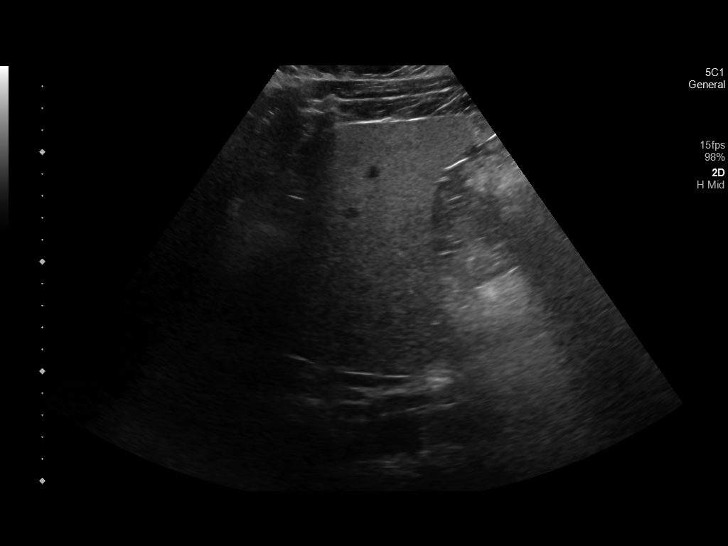
[im 25/38]
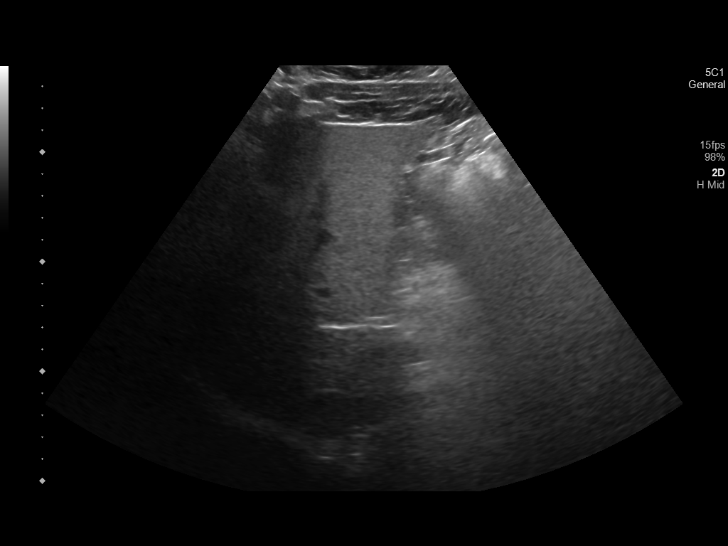
[im 28/38]
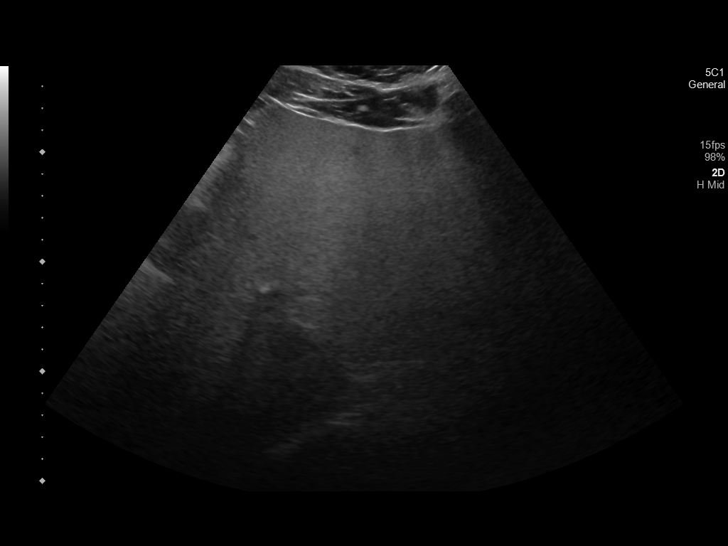
[im 31/38]
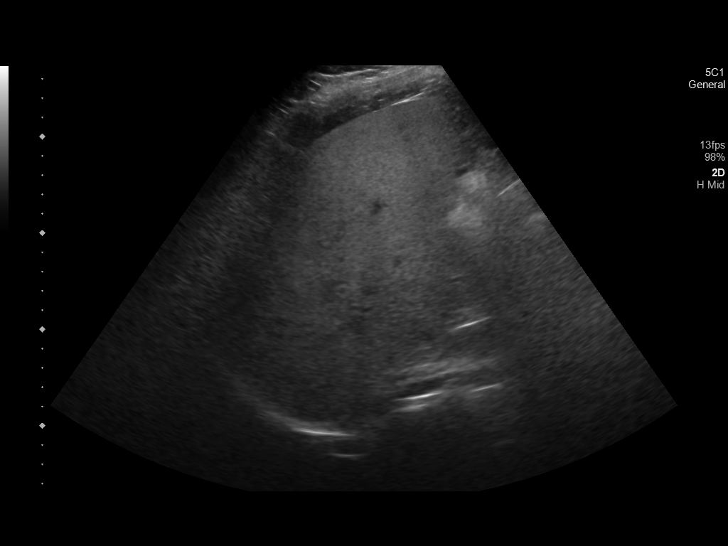
[im 34/38]
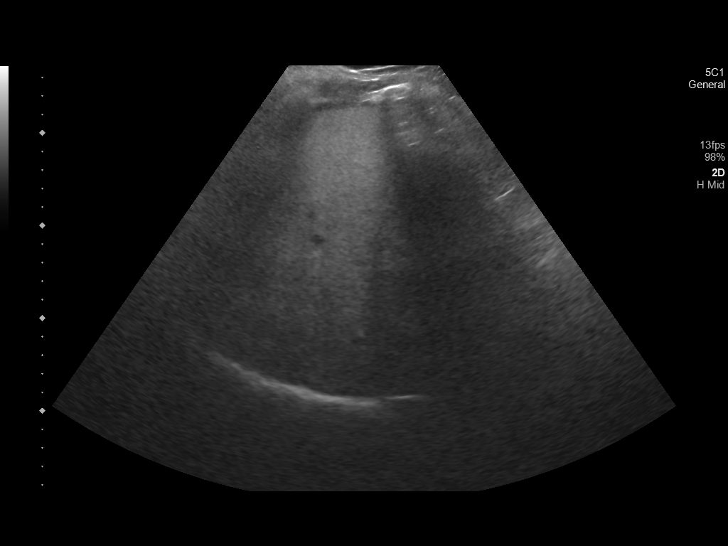
[im 38/38]
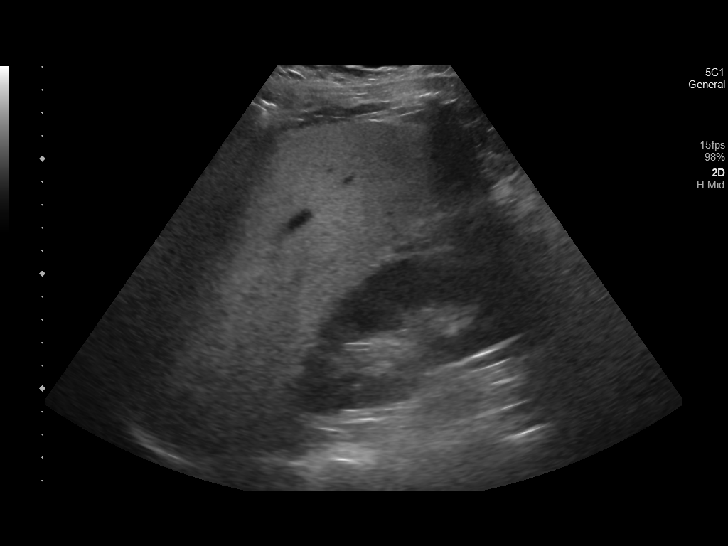

[14 of 25 positions shown; findings below may reference images not displayed]

FINDINGS: Gallbladder:

Small mobile 6 mm gallstone (image 19). Gallbladder wall thickness
remains normal to 3 mm. No pericholecystic fluid. No sonographic
Murphy sign elicited.

Common bile duct:

Diameter: 5 mm, normal.

Liver:

Echogenic liver (image 34), difficult to penetrate in some areas. No
discrete liver lesion. No intrahepatic biliary ductal dilatation.
Portal vein is patent on color Doppler imaging with normal direction
of blood flow towards the liver.

Other: Negative visible right kidney. No right upper quadrant free
fluid.
IMPRESSION: 1. Small gallstone. No evidence of acute cholecystitis or bile duct
obstruction.
2. Fatty liver disease.

## 2023-07-18 IMAGING — CT CT RENAL STONE PROTOCOL
2 of 4 series · 15 of 46 positions shown, 17 images · non-contrast
Comparison: None.

CLINICAL DATA: Right-sided rib and flank pain.



[Series 2: stone full standard · axial · 0.96mm/px · z∈[-1114,-599]mm · 12 of 115 slices shown, 14 images]
[im 6/115  soft-tissue]
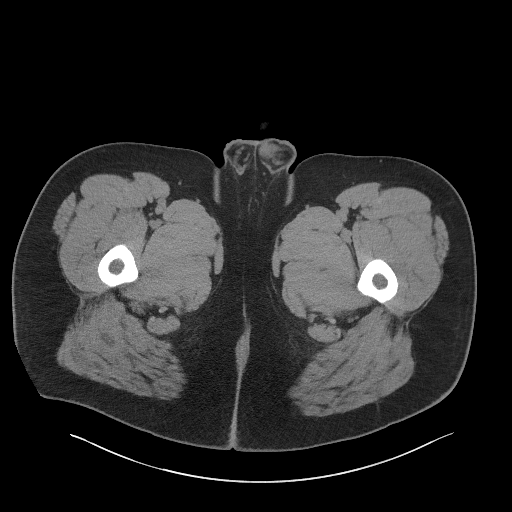
[im 6/115  bone]
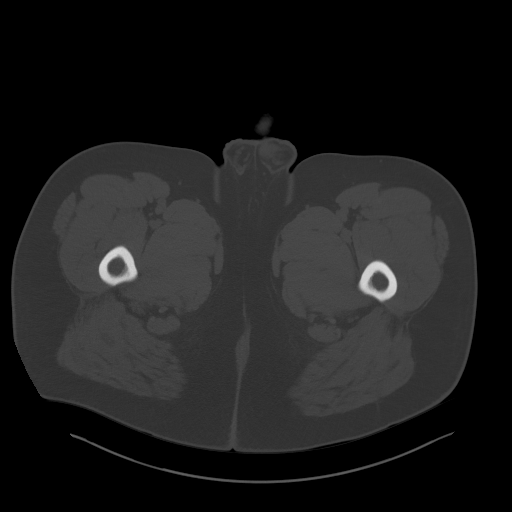
[im 16/115  soft-tissue]
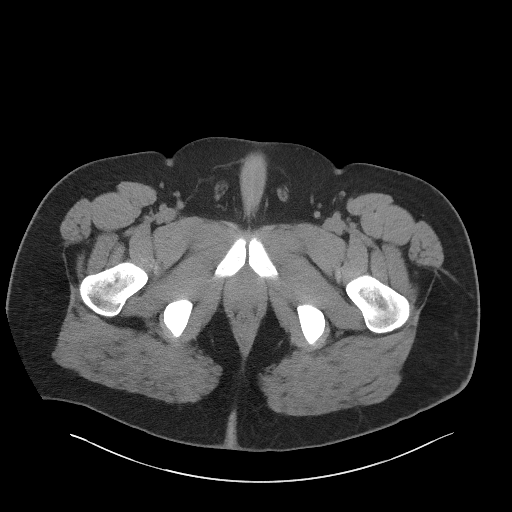
[im 26/115  soft-tissue]
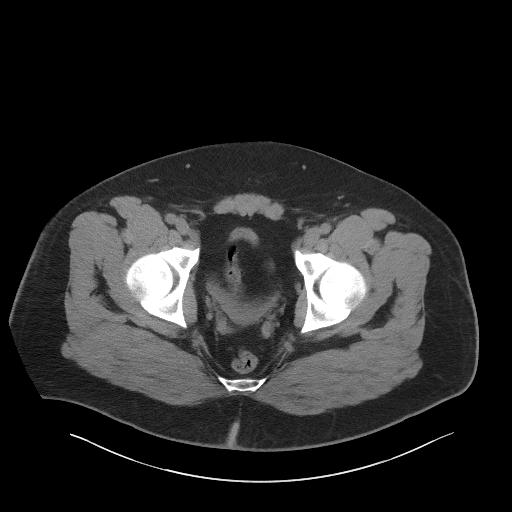
[im 37/115  soft-tissue]
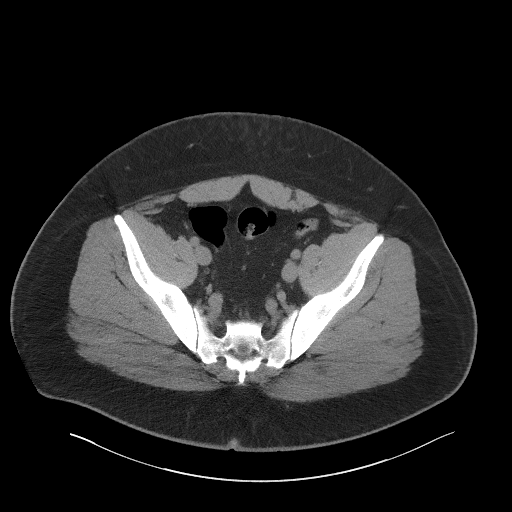
[im 42/115  soft-tissue]
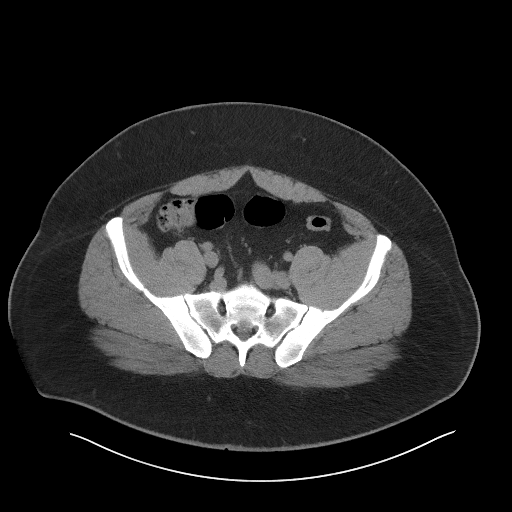
[im 52/115  soft-tissue]
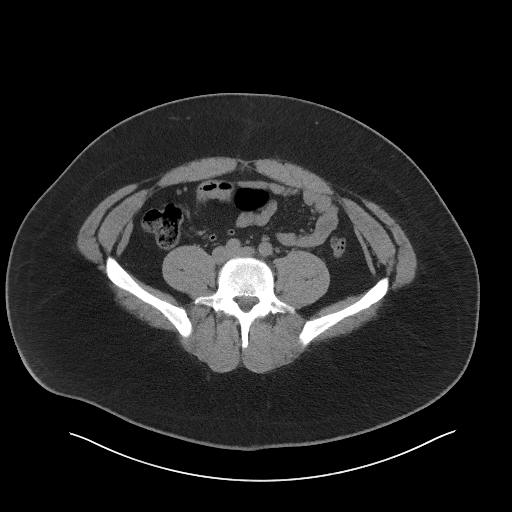
[im 63/115  soft-tissue]
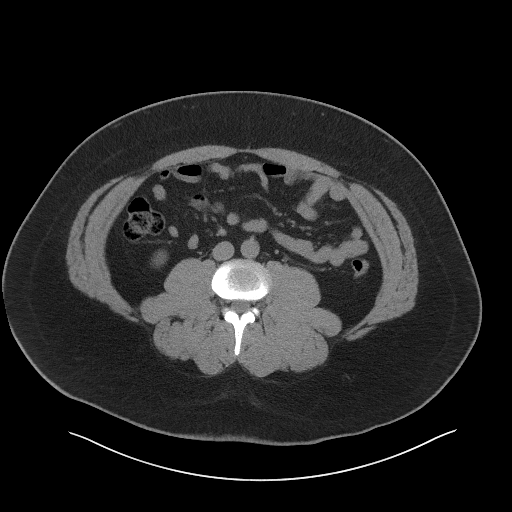
[im 73/115  soft-tissue]
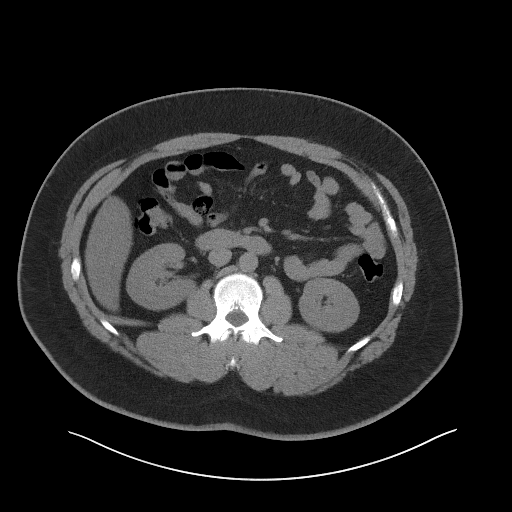
[im 78/115  soft-tissue]
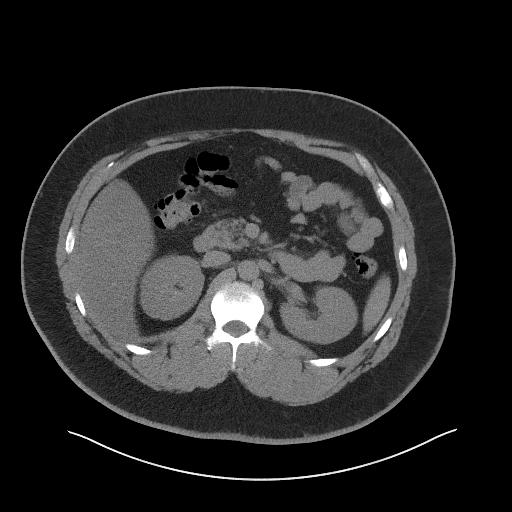
[im 78/115  bone]
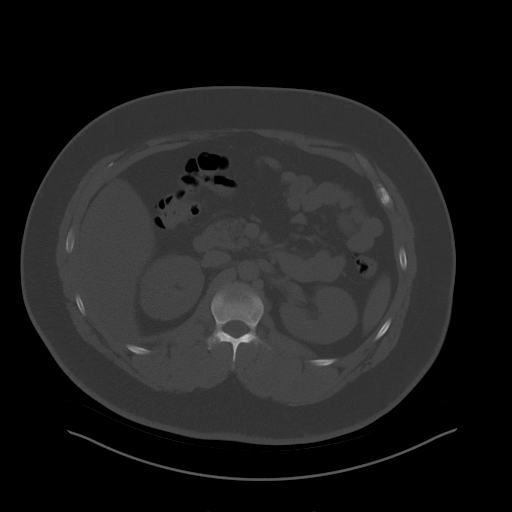
[im 89/115  soft-tissue]
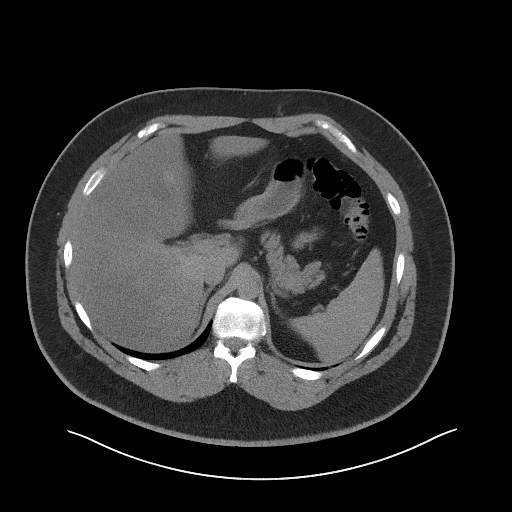
[im 99/115  soft-tissue]
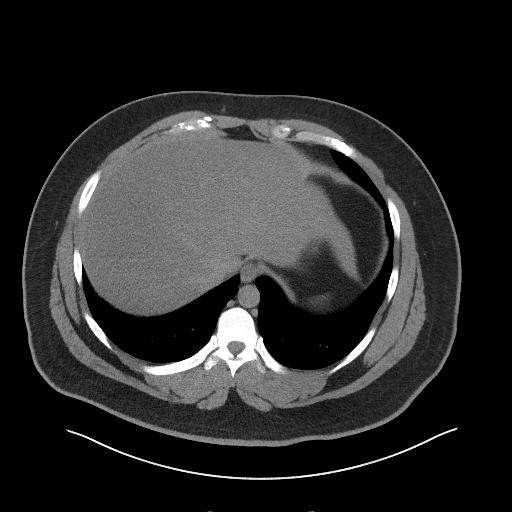
[im 109/115  soft-tissue]
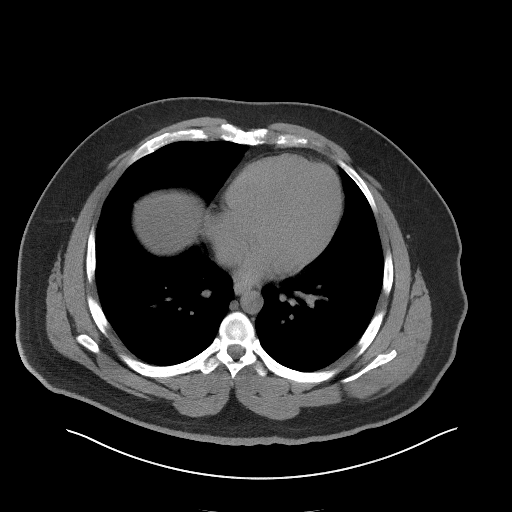

[Series 5: coronal · coronal · 0.79mm/px · 3 of 161 slices shown]
[im 54/161  soft-tissue]
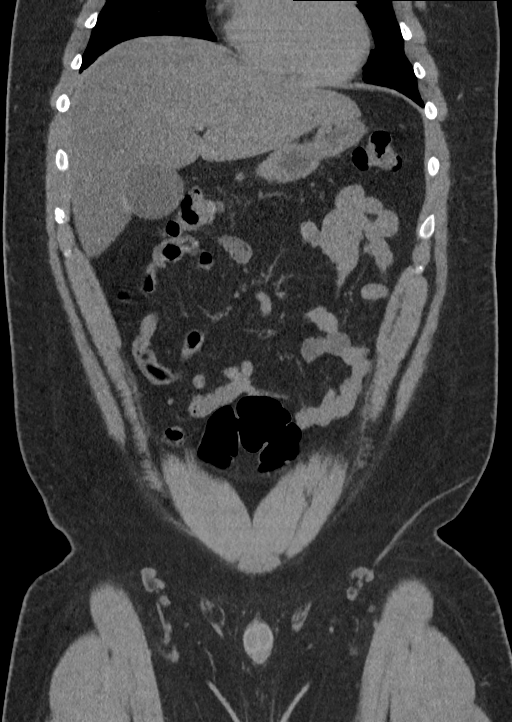
[im 72/161  soft-tissue]
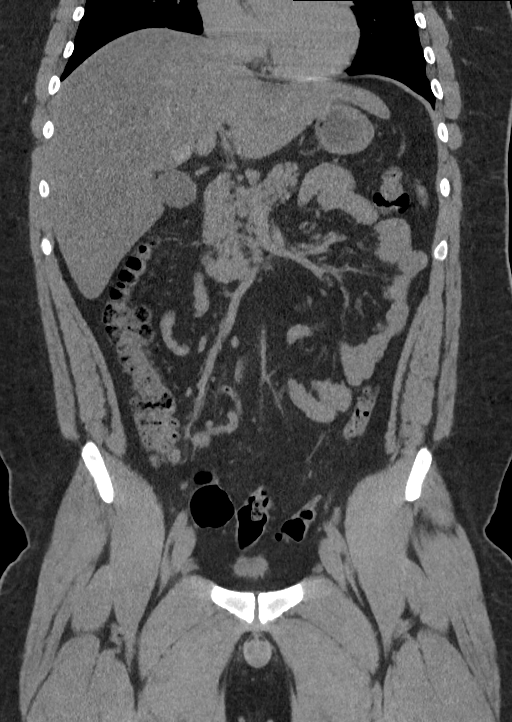
[im 89/161  soft-tissue]
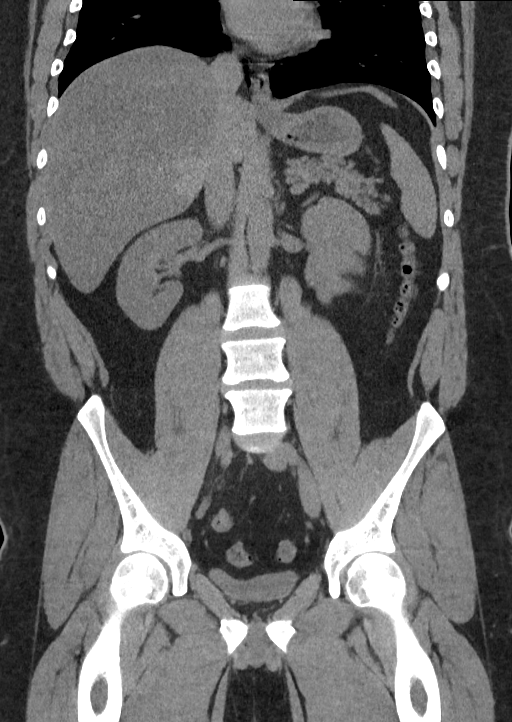

[15 of 46 positions shown; findings below may reference images not displayed]

FINDINGS: Lower chest: Unremarkable. No evidence for right pleural effusion.
No focal airspace consolidation at the right base.

Hepatobiliary: The liver shows diffusely decreased attenuation
suggesting fat deposition. No focal abnormality in the liver on this
study without intravenous contrast. Subcapsular areas of increased
attenuation in segment IV and along the gallbladder fossa are
compatible with areas of fatty sparing. There is no evidence for
gallstones, gallbladder wall thickening, or pericholecystic fluid.
No intrahepatic or extrahepatic biliary dilation.

Pancreas: No focal mass lesion. No dilatation of the main duct. No
intraparenchymal cyst. No peripancreatic edema.

Spleen: No splenomegaly. No focal mass lesion.

Adrenals/Urinary Tract: No adrenal nodule or mass. Kidneys
unremarkable. Specifically, no evidence for stone disease in either
kidney or ureter. No secondary changes in either kidney or ureter.
The urinary bladder appears normal for the degree of distention.

Stomach/Bowel: Stomach is unremarkable. No gastric wall thickening.
No evidence of outlet obstruction. Duodenum is normally positioned
as is the ligament of Treitz. No small bowel wall thickening. No
small bowel dilatation. The terminal ileum is normal. The appendix
is normal. No gross colonic mass. No colonic wall thickening.

Vascular/Lymphatic: No abdominal aortic aneurysm There is no
gastrohepatic or hepatoduodenal ligament lymphadenopathy. No
retroperitoneal or mesenteric lymphadenopathy. No pelvic sidewall
lymphadenopathy.

Reproductive: The prostate gland and seminal vesicles are
unremarkable.

Other: No intraperitoneal free fluid.

Musculoskeletal: No worrisome lytic or sclerotic osseous
abnormality. Visualized lower right ribs are unremarkable. Small
sclerotic focus right femoral neck is likely a bone island.
IMPRESSION: 1. No acute findings in the abdomen or pelvis. Specifically, no
stone disease or secondary change in either kidney or ureter. No
findings to explain the patient's history of right-sided pain.
2. Hepatic steatosis.

## 2023-08-18 ENCOUNTER — Other Ambulatory Visit: Payer: Self-pay

## 2023-08-18 DIAGNOSIS — R42 Dizziness and giddiness: Secondary | ICD-10-CM | POA: Insufficient documentation

## 2023-08-18 LAB — BASIC METABOLIC PANEL
Anion gap: 10 (ref 5–15)
BUN: 14 mg/dL (ref 6–20)
CO2: 24 mmol/L (ref 22–32)
Calcium: 9.3 mg/dL (ref 8.9–10.3)
Chloride: 102 mmol/L (ref 98–111)
Creatinine, Ser: 0.84 mg/dL (ref 0.61–1.24)
GFR, Estimated: >60 mL/min (ref >60)
Glucose, Bld: 136 mg/dL — ABNORMAL HIGH (ref 70–99)
Potassium: 3.5 mmol/L (ref 3.5–5.1)
Sodium: 136 mmol/L (ref 135–145)

## 2023-08-18 LAB — CBC
HCT: 47.9 % (ref 39.0–52.0)
Hemoglobin: 16.4 g/dL (ref 13.0–17.0)
MCH: 31.4 pg (ref 26.0–34.0)
MCHC: 34.2 g/dL (ref 30.0–36.0)
MCV: 91.6 fL (ref 80.0–100.0)
Platelets: 239 10*3/uL (ref 150–400)
RBC: 5.23 MIL/uL (ref 4.22–5.81)
RDW: 11.9 % (ref 11.5–15.5)
WBC: 10.3 10*3/uL (ref 4.0–10.5)
nRBC: 0 % (ref 0.0–0.2)

## 2023-08-18 LAB — CBG MONITORING, ED: Glucose-Capillary: 124 mg/dL — ABNORMAL HIGH (ref 70–99)

## 2023-08-18 NOTE — ED Triage Notes (Addendum)
 Pt to ED via POV c/o dizziness and "chest feeling cold". Reports started about an hr ago. Denies any dizziness at this time. Says he was sitting down when he felt dizzy. Pt also saying that left side of face near ear feels numb. Pt not having any facial numbness, drooping, slurred speech. NIH 0. Pt also reports drinking 4 beers within 30 mins

## 2023-08-19 ENCOUNTER — Emergency Department
Admission: EM | Admit: 2023-08-19 | Discharge: 2023-08-19 | Disposition: A | Discharge status: Home / Self Care | Payer: Self-pay | Attending: Emergency Medicine | Admitting: Emergency Medicine

## 2023-08-19 DIAGNOSIS — R42 Dizziness and giddiness: Secondary | ICD-10-CM

## 2023-08-19 LAB — ETHANOL: Alcohol, Ethyl (B): <10 mg/dL (ref <10)

## 2023-08-19 LAB — TROPONIN I (HIGH SENSITIVITY): Troponin I (High Sensitivity): 4 ng/L (ref <18)

## 2023-08-19 NOTE — ED Provider Notes (Signed)
 [facility] Provider Note    Event Date/Time   First MD Initiated Contact with Patient 08/19/23 [phone]     (approximate)   History   Dizziness   HPI Matthew Holden is a 28 y.o. male with no chronic medical issues who presents for evaluation of a variety of complaints.  He reports that he was at the gym with a friend and then immediately after leaving the gym he drank 4 beers over the course of about 30 minutes.  At that point he said that his chest felt "cold".  He also felt a little bit numb on his left ear and on his left arm so he thought he should be checked out.  His symptoms have been gone for hours.  He denies recent fever, shortness of breath, nausea, vomiting, or abdominal pain.  He had no focal weakness in his arms or his legs and has been ambulatory and conversant without any difficulty.     Physical Exam   Triage Vital Signs: ED Triage Vitals  Encounter Vitals Group     BP 08/18/23 2305 (!) 158/90     Systolic BP Percentile --      Diastolic BP Percentile --      Pulse Rate 08/18/23 2305 (!) 103     Resp 08/18/23 2305 18     Temp 08/18/23 2305 98.5 F (36.9 C)     Temp Source 08/18/23 2305 Oral     SpO2 08/18/23 2305 97 %     Weight 08/18/23 2303 131.5 kg (290 lb)     Height 08/18/23 2303 1.753 m (5\' 9" )     Head Circumference --      Peak Flow --      Pain Score 08/18/23 2303 0     Pain Loc --      Pain Education --      Exclude from Growth Chart --     Most recent vital signs: Vitals:   08/18/23 2305  BP: (!) 158/90  Pulse: (!) 103  Resp: 18  Temp: 98.5 F (36.9 C)  SpO2: 97%    General: Awake, no distress.  Well-appearing. CV:  Good peripheral perfusion.  Regular rate and rhythm, occasionally mildly tachycardic when ambulatory. Resp:  Normal effort. Speaking easily and comfortably, no accessory muscle usage nor intercostal retractions.   Abd:  No distention.  Other:  No focal neurological deficits, no aphasia nor  dysarthria, no facial droop, ambulatory without difficulty.   ED Results / Procedures / Treatments   Labs (all labs ordered are listed, but only abnormal results are displayed) Labs Reviewed  BASIC METABOLIC PANEL - Abnormal; Notable for the following components:      Result Value   Glucose, Bld 136 (*)    All other components within normal limits  CBG MONITORING, ED - Abnormal; Notable for the following components:   Glucose-Capillary 124 (*)    All other components within normal limits  CBC  ETHANOL  TROPONIN I (HIGH SENSITIVITY)     EKG  ED ECG REPORT I, Loleta Rose, the attending physician, personally viewed and interpreted this ECG.  Date: 08/18/2023 EKG Time: 23: 14 Rate: 103 Rhythm: Sinus tachycardia QRS Axis: normal Intervals: normal ST/T Wave abnormalities: normal Narrative Interpretation: no evidence of acute ischemia    PROCEDURES:  Critical Care performed: No  Procedures    IMPRESSION / MDM / ASSESSMENT AND PLAN / ED COURSE  I reviewed the triage vital signs and the nursing notes.  Differential diagnosis includes, but is not limited to, medication or drug side effect, intoxication, hypoglycemia, CVA, ACS, PE  Patient's presentation is most consistent with acute presentation with potential threat to life or bodily function.  Labs/studies ordered: EKG, high-sensitivity troponin, ethanol level, CBC, basic metabolic panel  Interventions/Medications given:  Medications - No data to display  (Note:  hospital course my include additional interventions and/or labs/studies not listed above.)   Reassuring physical exam and medical evaluation.  No acute abnormalities identified and the patient is asymptomatic.  Low risk for ACS, very low risk for CVA well score for PE is 0, and even though he was tachycardic when he arrived, the tachycardia has not persisted and with the resolution of the tachycardia he is PERC  negative.  There is no indication patient has had an emergent medical issue and he does not require any additional imaging or evaluation.  I provided reassurance and he is comfortable with the plan for discharge and outpatient follow-up.  He does not have a PCP so I provided him with information about local primary care providers and I gave my usual and customary follow-up recommendations and return precautions.       FINAL CLINICAL IMPRESSION(S) / ED DIAGNOSES   Final diagnoses:  Dizziness     Rx / DC Orders   ED Discharge Orders     None        Note:  This document was prepared using Dragon voice recognition software and may include unintentional dictation errors.   Loleta Rose, MD 08/19/23 [phone]

## 2023-08-19 NOTE — Discharge Instructions (Signed)
 Your workup in the Emergency Department today was reassuring.  We did not find any specific abnormalities.  We recommend you drink plenty of fluids, take your regular medications and/or any new ones prescribed today, and follow up with the doctor(s) listed in these documents as recommended.  Return to the Emergency Department if you develop new or worsening symptoms that concern you.

## 2023-12-29 ENCOUNTER — Emergency Department
Admission: EM | Admit: 2023-12-29 | Discharge: 2023-12-29 | Disposition: A | Discharge status: Home / Self Care | Payer: Self-pay | Attending: Emergency Medicine | Admitting: Emergency Medicine

## 2023-12-29 ENCOUNTER — Emergency Department: Payer: Self-pay

## 2023-12-29 ENCOUNTER — Other Ambulatory Visit: Payer: Self-pay

## 2023-12-29 DIAGNOSIS — I10 Essential (primary) hypertension: Secondary | ICD-10-CM | POA: Insufficient documentation

## 2023-12-29 DIAGNOSIS — R202 Paresthesia of skin: Secondary | ICD-10-CM | POA: Insufficient documentation

## 2023-12-29 LAB — CBC
HCT: 45.6 % (ref 39.0–52.0)
Hemoglobin: 15.7 g/dL (ref 13.0–17.0)
MCH: 30.7 pg (ref 26.0–34.0)
MCHC: 34.4 g/dL (ref 30.0–36.0)
MCV: 89.1 fL (ref 80.0–100.0)
Platelets: 250 K/uL (ref 150–400)
RBC: 5.12 MIL/uL (ref 4.22–5.81)
RDW: 11.9 % (ref 11.5–15.5)
WBC: 9.8 K/uL (ref 4.0–10.5)
nRBC: 0 % (ref 0.0–0.2)

## 2023-12-29 LAB — COMPREHENSIVE METABOLIC PANEL WITH GFR
ALT: 73 U/L — ABNORMAL HIGH (ref 0–44)
AST: 47 U/L — ABNORMAL HIGH (ref 15–41)
Albumin: 4 g/dL (ref 3.5–5.0)
Alkaline Phosphatase: 46 U/L (ref 38–126)
Anion gap: 10 (ref 5–15)
BUN: 11 mg/dL (ref 6–20)
CO2: 23 mmol/L (ref 22–32)
Calcium: 9.1 mg/dL (ref 8.9–10.3)
Chloride: 105 mmol/L (ref 98–111)
Creatinine, Ser: 0.76 mg/dL (ref 0.61–1.24)
GFR, Estimated: >60 mL/min (ref >60)
Glucose, Bld: 127 mg/dL — ABNORMAL HIGH (ref 70–99)
Potassium: 3.5 mmol/L (ref 3.5–5.1)
Sodium: 138 mmol/L (ref 135–145)
Total Bilirubin: 0.7 mg/dL (ref 0.0–1.2)
Total Protein: 7.8 g/dL (ref 6.5–8.1)

## 2023-12-29 LAB — ETHANOL: Alcohol, Ethyl (B): <15 mg/dL (ref <15)

## 2023-12-29 LAB — DIFFERENTIAL
Abs Immature Granulocytes: 0.1 K/uL — ABNORMAL HIGH (ref 0.00–0.07)
Basophils Absolute: 0 K/uL (ref 0.0–0.1)
Basophils Relative: 0 %
Eosinophils Absolute: 0.1 K/uL (ref 0.0–0.5)
Eosinophils Relative: 1 %
Immature Granulocytes: 1 %
Lymphocytes Relative: 32 %
Lymphs Abs: 3.1 K/uL (ref 0.7–4.0)
Monocytes Absolute: 0.7 K/uL (ref 0.1–1.0)
Monocytes Relative: 7 %
Neutro Abs: 5.8 K/uL (ref 1.7–7.7)
Neutrophils Relative %: 59 %

## 2023-12-29 LAB — CBG MONITORING, ED: Glucose-Capillary: 130 mg/dL — ABNORMAL HIGH (ref 70–99)

## 2023-12-29 MED ORDER — HYDROXYZINE HCL 25 MG PO TABS
25.0000 mg | ORAL_TABLET | Freq: Every day | ORAL | 0 refills | Status: AC | PRN
Start: 2023-12-29 — End: ?

## 2023-12-29 MED ORDER — HYDROXYZINE HCL 25 MG PO TABS
25.0000 mg | ORAL_TABLET | Freq: Once | ORAL | Status: AC
Start: 2023-12-29 — End: 2023-12-29
  Administered 2023-12-29: 25 mg via ORAL
  Filled 2023-12-29: qty 1

## 2023-12-29 NOTE — ED Provider Notes (Signed)
 [facility] Provider Note    Event Date/Time   First MD Initiated Contact with Patient 12/29/23 2132     (approximate)  History   Chief Complaint: Numbness  HPI  Matthew Holden is a 28 y.o. male with a past medical history of hypertension, hyperlipidemia, presents to the emergency department for multiple symptoms.  According to the patient earlier today he felt a numb sensation to the small area on his left face.  He states shortly afterwards he felt a tingling sensation throughout his body mostly in his hands.  Patient states this lasted for up to an hour or so and then slowly resolved.  Patient states no symptoms in the emergency department.  Denies any chest pain or shortness of breath.  Denies any weakness of any arm or leg denies any slurred speech or confusion.  Physical Exam   Triage Vital Signs: ED Triage Vitals  Encounter Vitals Group     BP 12/29/23 1948 (!) 169/90     Girls Systolic BP Percentile --      Girls Diastolic BP Percentile --      Boys Systolic BP Percentile --      Boys Diastolic BP Percentile --      Pulse Rate 12/29/23 1948 95     Resp 12/29/23 1948 18     Temp 12/29/23 1948 99.3 F (37.4 C)     Temp Source 12/29/23 1948 Oral     SpO2 12/29/23 1948 98 %     Weight --      Height --      Head Circumference --      Peak Flow --      Pain Score 12/29/23 1946 0     Pain Loc --      Pain Education --      Exclude from Growth Chart --     Most recent vital signs: Vitals:   12/29/23 1948  BP: (!) 169/90  Pulse: 95  Resp: 18  Temp: 99.3 F (37.4 C)  SpO2: 98%    General: Awake, no distress.  CV:  Good peripheral perfusion.  Regular rate and rhythm  Resp:  Normal effort.  Equal breath sounds bilaterally.  Abd:  No distention.  Soft, nontender.  No rebound or guarding. Other:  Equal grip strength bilaterally.  No pronator drift.  No cranial nerve deficits besides an irregular left pupil which he states is  chronic.   ED Results / Procedures / Treatments   EKG  EKG viewed and interpreted by myself shows a normal sinus rhythm at 88 bpm with a narrow QRS, normal axis, normal intervals, no concerning ST changes.  RADIOLOGY  I have reviewed interpreted CT head images.  No bleed seen on my evaluation. Radiology is read the CT scan as negative.   MEDICATIONS ORDERED IN ED: Medications - No data to display   IMPRESSION / MDM / ASSESSMENT AND PLAN / ED COURSE  I reviewed the triage vital signs and the nursing notes.  Patient's presentation is most consistent with acute presentation with potential threat to life or bodily function.  Patient presents to the emergency department for numbness/paresthesia symptoms that occurred earlier today.  Overall the patient appears well, denies any symptoms currently.  Reassuring physical exam, reassuring neurological exam.  Patient's workup in the emergency department shows a reassuring CBC, overall reassuring chemistry besides mild LFT elevation.  Patient states he was a heavy alcohol user however has significantly decreased his alcohol consumption over  the last 2 months.  No abdominal pain.  Alcohol level today is negative.  CT scan of the head is negative.  EKG shows no concerning findings.  Discussed with the patient possibility of anxiety although it is unclear.  I discussed a trial of hydroxyzine  to see if this helps his symptoms I also discussed outpatient PCP follow-up.  Provided my typical paresthesia return precautions.  Patient agreeable to plan.  FINAL CLINICAL IMPRESSION(S) / ED DIAGNOSES   Paresthesias  Rx / DC Orders   Hydroxyzine   Note:  This document was prepared using Dragon voice recognition software and may include unintentional dictation errors.   Dorothyann Drivers, MD 12/29/23 2146

## 2023-12-29 NOTE — ED Triage Notes (Signed)
 Pt to ED via POV c/o left sided facial numbness and upper body numbness. Pt reports this started today around 12pm and states that it comes and goes. No facial droop, slurred speech, extremity drift, weakness. Denies CP, SOB, fevers, dizziness
